# Patient Record
Sex: Male | Born: 1985 | Race: Black or African American | Hispanic: No | Marital: Single | Smoking: Current every day smoker
Health system: Southern US, Community
[De-identification: ages and names within clinical notes are randomized; demographics above are authoritative.]

## PROBLEM LIST (undated history)

## (undated) HISTORY — PX: NECK SURGERY: SHX720

---
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
## 2006-01-20 ENCOUNTER — Encounter: Admission: RE | Admit: 2006-01-20 | Discharge: 2006-01-20 | Payer: Self-pay | Admitting: Neurosurgery

## 2006-03-03 ENCOUNTER — Encounter: Admission: RE | Admit: 2006-03-03 | Discharge: 2006-03-03 | Payer: Self-pay | Admitting: Neurosurgery

## 2006-07-28 ENCOUNTER — Encounter: Admission: RE | Admit: 2006-07-28 | Discharge: 2006-07-28 | Payer: Self-pay | Admitting: Neurosurgery
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
## 2006-09-07 IMAGING — CR DG CERVICAL SPINE 1V
2 series · 2 of 2 positions shown · non-contrast
Comparison: Plain radiographs 07/31/05 and CT of the cervical spine 07/30/05.

CLINICAL DATA: cervical spine fracture at C2.
 LATERAL CERVICAL SPINE ? 2 FILMS:

[view not recorded (1 of 2)]
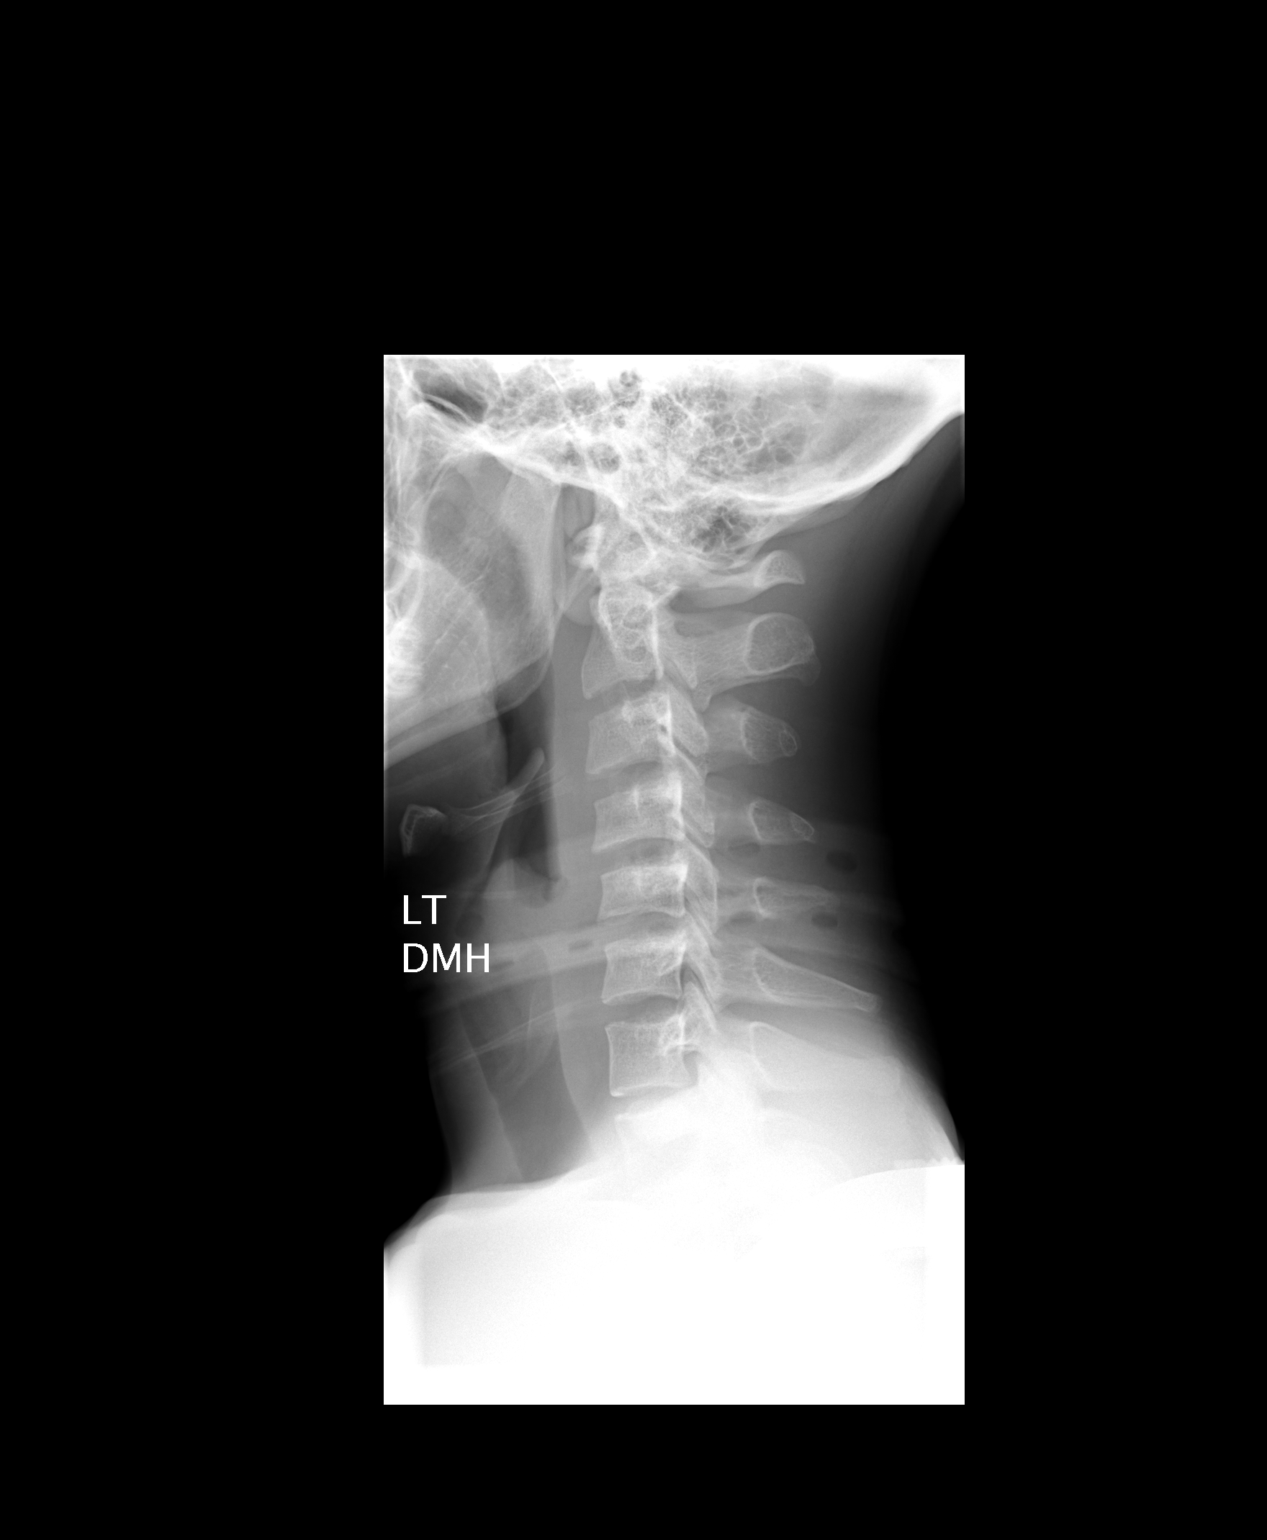

[view not recorded (2 of 2)]
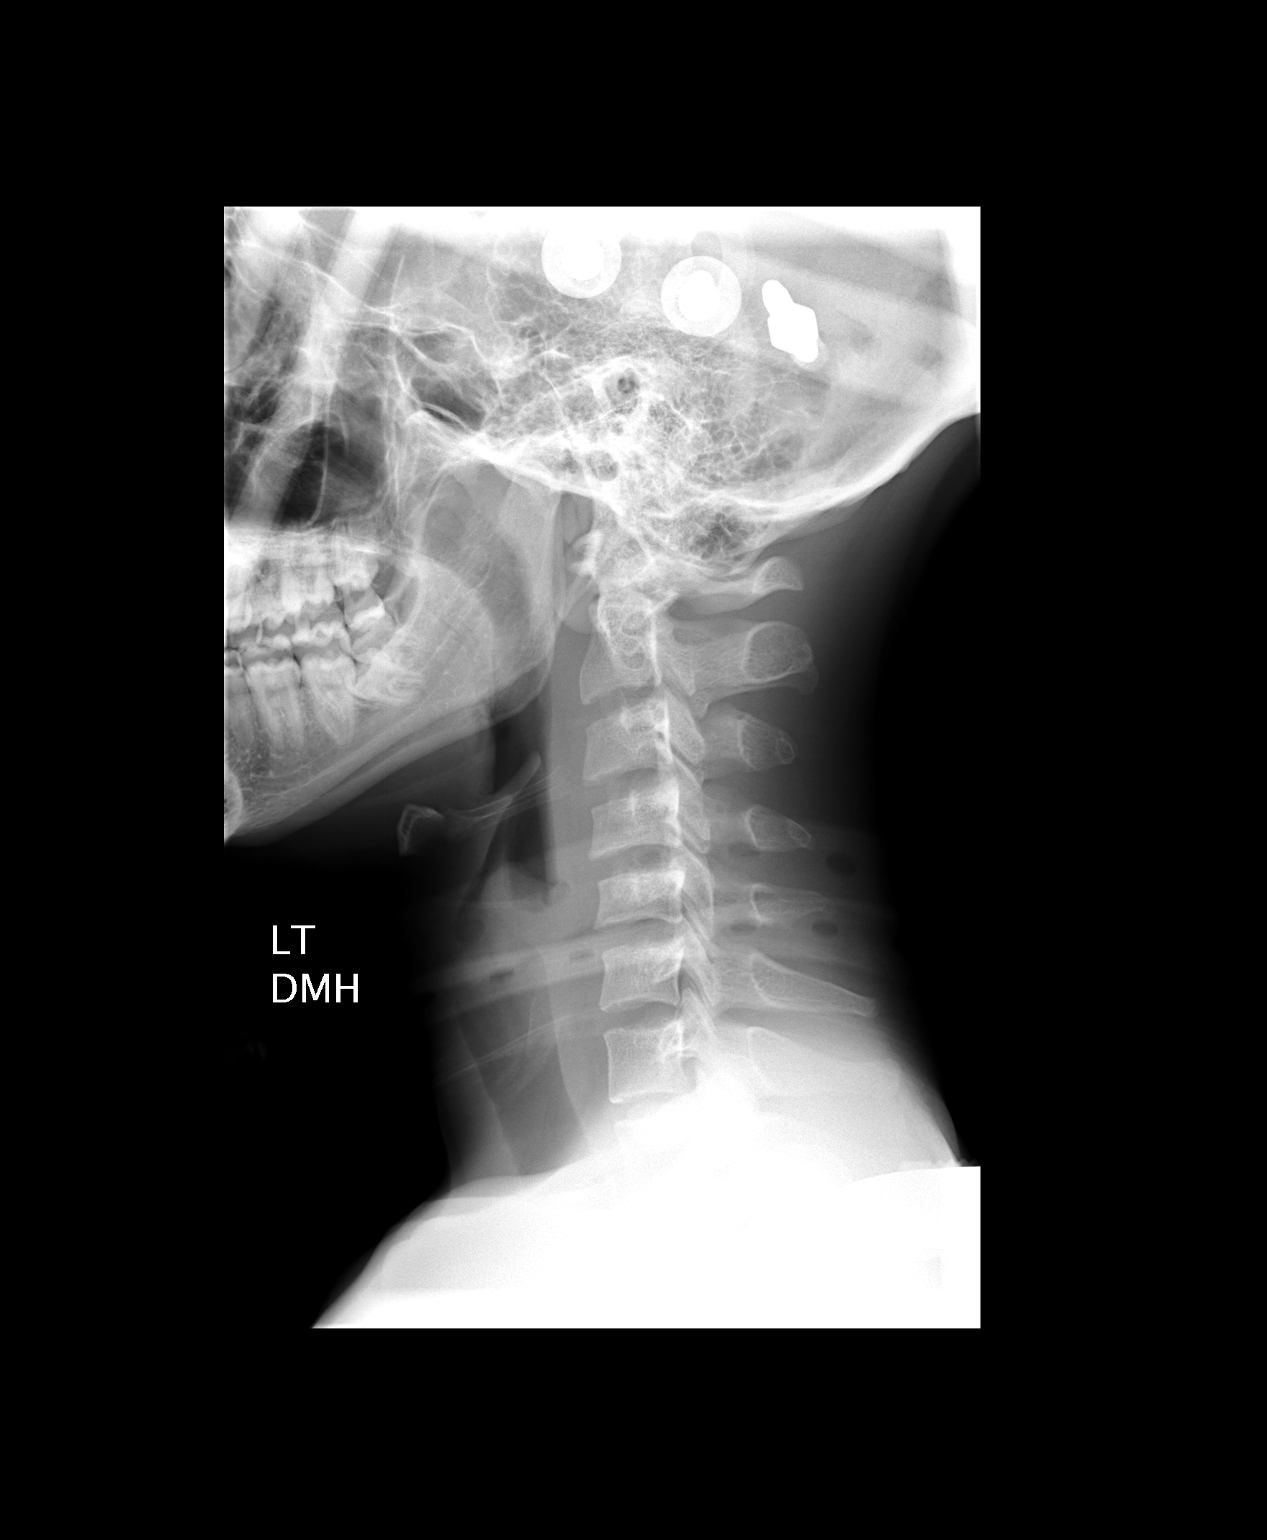

[2 of 2 positions shown; findings below may reference images not displayed]

FINDINGS: External fixation device is partly visualized.  There is loss of the normal cervical lordosis.  Lucency is seen at the base of the dens without definite displacement, although visualization is suboptimal on a single lateral projection only.
IMPRESSION: No interval change or significant displacement of fracture fragments of C2 fracture is seen.
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
## 2006-12-11 IMAGING — CR DG CERVICAL SPINE 1V
1 series · 1 of 1 positions shown · non-contrast
Comparison: none

CLINICAL DATA: C2 fracture.
 CERVICAL SPINE, ONE VIEW:
 Single lateral view reveals the type II dens fracture to be in satisfactory position and alignment on this single view.  No subluxation.  On sagittal reformatted CT images on 10/22/05, there was 3.4 mm of anterior displacement of the odontoid process on the body. On today's films, I detect no evidence of displacement.  Cervical kyphosis.

[w c-spine lat]
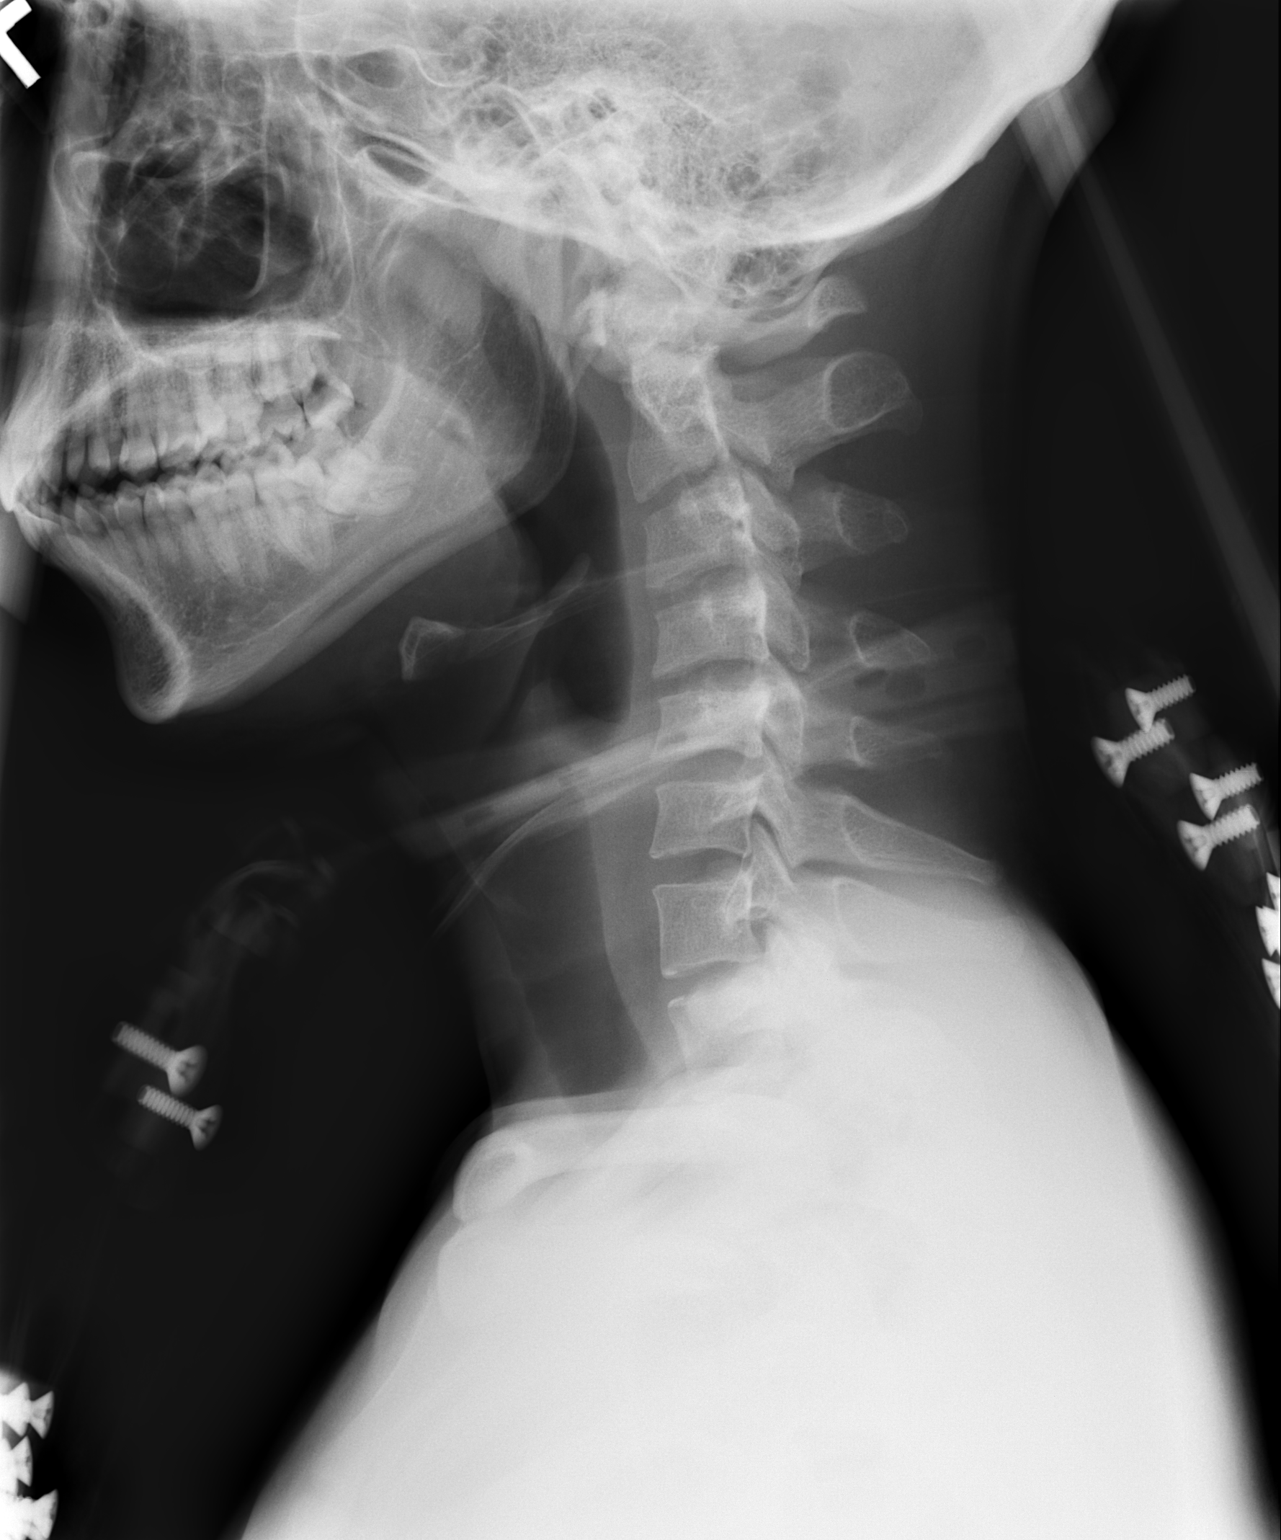

[1 of 1 positions shown; findings below may reference images not displayed]

IMPRESSION: Satisfactory position and a alignment of C2 fracture.

## 2012-11-09 ENCOUNTER — Encounter (HOSPITAL_COMMUNITY): Payer: Self-pay | Admitting: *Deleted

## 2012-11-09 ENCOUNTER — Emergency Department (HOSPITAL_COMMUNITY)
Admission: EM | Admit: 2012-11-09 | Discharge: 2012-11-09 | Disposition: A | Discharge status: Home / Self Care | Payer: Self-pay | Attending: Emergency Medicine | Admitting: Emergency Medicine

## 2012-11-09 DIAGNOSIS — F17200 Nicotine dependence, unspecified, uncomplicated: Secondary | ICD-10-CM | POA: Insufficient documentation

## 2012-11-09 DIAGNOSIS — IMO0002 Reserved for concepts with insufficient information to code with codable children: Secondary | ICD-10-CM

## 2012-11-09 DIAGNOSIS — L03019 Cellulitis of unspecified finger: Secondary | ICD-10-CM | POA: Insufficient documentation

## 2012-11-09 MED ORDER — HYDROCODONE-ACETAMINOPHEN 5-325 MG PO TABS: 1.0000 | ORAL_TABLET | ORAL | Status: AC | PRN

## 2012-11-09 NOTE — ED Notes (Addendum)
Pt states that on 12/4 in the AM he began experiencing pain and swelling in his R middle finger. Pt states that tried to "tough it out" but the pain has not subsided. Pt states that he worked to day, which requires a lot of hand movement. He states that he feels that this may have caused his hand to become aggravated. Pt rates his pain as 8/10.  Pt denies any trauma.

## 2012-11-09 NOTE — ED Provider Notes (Signed)
Medical screening examination/treatment/procedure(s) were performed by non-physician practitioner and as supervising physician I was immediately available for consultation/collaboration.  Noble Bodie M Tylin Force, MD 11/09/12 0715 

## 2012-11-09 NOTE — ED Provider Notes (Signed)
History     CSN: [id number]  Arrival date & time 11/09/12  4098   None     Chief Complaint  Patient presents with  . Finger Injury    (Consider location/radiation/quality/duration/timing/severity/associated sxs/prior treatment) HPI History provided by pt.   Pt c/o pain in right middle finger since yesterday morning.  Associated w/ edema.  Soaked in warm water but no drainage or relief of pain.  No associated fever.  Bites fingernails.   History reviewed. No pertinent past medical history.  Past Surgical History  Procedure Date  . Neck surgery     History reviewed. No pertinent family history.  History  Substance Use Topics  . Smoking status: Current Every Day Smoker  . Smokeless tobacco: Not on file  . Alcohol Use: Yes      Review of Systems  All other systems reviewed and are negative.    Allergies  Review of patient's allergies indicates no known allergies.  Home Medications   Current Outpatient Rx  Name  Route  Sig  Dispense  Refill  . HYDROCODONE-ACETAMINOPHEN 5-325 MG PO TABS   Oral   Take 1 tablet by mouth every 4 (four) hours as needed for pain.   10 tablet   0     BP 143/93  Pulse 83  Temp 98.7 F (37.1 C) (Oral)  Resp 18  SpO2 100%  Physical Exam  Nursing note and vitals reviewed. Constitutional: He is oriented to person, place, and time. He appears well-developed and well-nourished. No distress.  HENT:  Head: Normocephalic and atraumatic.  Eyes:       Normal appearance  Neck: Normal range of motion.  Pulmonary/Chest: Effort normal.  Musculoskeletal: Normal range of motion.       Paronychia of proximal cuticle of right middle finger.  No drainage.  Mild, diffuse edema of distal phalanx but no induration or tenderness of pad of finger.  Distal sensation intact.   Neurological: He is alert and oriented to person, place, and time.  Psychiatric: He has a normal mood and affect. His behavior is normal.    ED Course  Procedures  (including critical care time)  Labs Reviewed - No data to display No results found.   1. 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  Cuticle lifted w/ 18 gauge needle, w/out anesthesia, a large amt of pus removed, and pt tolerated well.  Nursing staff bandaged.  Abx not prescribed because infection uncomplicated and pt immunocompetent and reliable.  Prescribed 10 vicodin for pain and recommended warm compresses TID.  Return precautions discussed.         Otilio Miu, PA-C 11/09/12 [phone]

## 2012-11-09 NOTE — ED Notes (Signed)
Pt states unknown reason for swelling, but his middle finger on his right hand is swollen. Pt states that it happened overnight and today he has been in pain and unable to ball a fist. Pt has cap refill and finger is swollen.

## 2020-03-14 ENCOUNTER — Ambulatory Visit: Payer: Self-pay | Attending: Internal Medicine

## 2020-03-14 DIAGNOSIS — Z23 Encounter for immunization: Secondary | ICD-10-CM

## 2020-03-14 NOTE — Progress Notes (Signed)
   Covid-19 Vaccination Clinic  Name:  Colin King    MRN: [id number] DOB: [birth year]  03/14/2020  [name] was observed post Covid-19 immunization for 15 minutes without incident. He was provided with Vaccine Information Sheet and instruction to access the V-Safe system.   [name] was instructed to call 911 with any severe reactions post vaccine: Marland Kitchen Difficulty breathing  . Swelling of face and throat  . A fast heartbeat  . A bad rash all over body  . Dizziness and weakness   Immunizations Administered    Name Date Dose VIS Date Route   Pfizer COVID-19 Vaccine 03/14/2020  2:24 PM 0.3 mL 11/16/2019 Intramuscular   Manufacturer: [employer], [employer]   Lot: [id number]   NDC: [address]-4

## 2020-04-07 ENCOUNTER — Ambulatory Visit: Payer: Self-pay | Attending: Internal Medicine

## 2020-04-07 DIAGNOSIS — Z23 Encounter for immunization: Secondary | ICD-10-CM

## 2020-04-07 NOTE — Progress Notes (Signed)
   Covid-19 Vaccination Clinic  Name:  Trayshawn Durkin    MRN: [id number] DOB: 05-Apr-1986  04/07/2020  Mr. Sopher was observed post Covid-19 immunization for 15 minutes without incident. He was provided with Vaccine Information Sheet and instruction to access the V-Safe system.   Mr. Humbarger was instructed to call 911 with any severe reactions post vaccine: Marland Kitchen Difficulty breathing  . Swelling of face and throat  . A fast heartbeat  . A bad rash all over body  . Dizziness and weakness   Immunizations Administered    Name Date Dose VIS Date Route   Pfizer COVID-19 Vaccine 04/07/2020  1:39 PM 0.3 mL 01/30/2019 Intramuscular   Manufacturer: [employer], [employer]   Lot: [id number]   NDC: [address]-1
